# Patient Record
Sex: Male | Born: 2000 | Race: White | Hispanic: No | Marital: Single | Smoking: Never smoker
Health system: Southern US, Community
[De-identification: ages and names within clinical notes are randomized; demographics above are authoritative.]

---
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
## 2022-01-14 ENCOUNTER — Ambulatory Visit (INDEPENDENT_AMBULATORY_CARE_PROVIDER_SITE_OTHER): Payer: Managed Care, Other (non HMO) | Admitting: Family Medicine

## 2022-01-14 ENCOUNTER — Encounter: Payer: Self-pay | Admitting: Family Medicine

## 2022-01-14 VITALS — BP 116/78 | HR 113 | Temp 97.8°F | Ht 69.95 in | Wt 141.0 lb

## 2022-01-14 DIAGNOSIS — Z7689 Persons encountering health services in other specified circumstances: Secondary | ICD-10-CM

## 2022-01-14 DIAGNOSIS — F900 Attention-deficit hyperactivity disorder, predominantly inattentive type: Secondary | ICD-10-CM

## 2022-01-14 DIAGNOSIS — M545 Low back pain, unspecified: Secondary | ICD-10-CM

## 2022-01-14 NOTE — Patient Instructions (Signed)
It was a pleasure meeting you today.   As discussed, make sure you are stretching before golf or other forms of exercise.   Follow up as needed.   Back Exercises These exercises help to make your trunk and back strong. They also help to keep the lower back flexible. Doing these exercises can help to prevent or lessen pain in your lower back. If you have back pain, try to do these exercises 2-3 times each day or as told by your doctor. As you get better, do the exercises once each day. Repeat the exercises more often as told by your doctor. To stop back pain from coming back, do the exercises once each day, or as told by your doctor. Do exercises exactly as told by your doctor. Stop right away if you feel sudden pain or your pain gets worse. Exercises Single knee to chest Do these steps 3-5 times in a row for each leg: Lie on your back on a firm bed or the floor with your legs stretched out. Bring one knee to your chest. Grab your knee or thigh with both hands and hold it in place. Pull on your knee until you feel a gentle stretch in your lower back or butt. Keep doing the stretch for 10-30 seconds. Slowly let go of your leg and straighten it. Pelvic tilt Do these steps 5-10 times in a row: Lie on your back on a firm bed or the floor with your legs stretched out. Bend your knees so they point up to the ceiling. Your feet should be flat on the floor. Tighten your lower belly (abdomen) muscles to press your lower back against the floor. This will make your tailbone point up to the ceiling instead of pointing down to your feet or the floor. Stay in this position for 5-10 seconds while you gently tighten your muscles and breathe evenly. Cat-cow Do these steps until your lower back bends more easily: Get on your hands and knees on a firm bed or the floor. Keep your hands under your shoulders, and keep your knees under your hips. You may put padding under your knees. Let your head hang down  toward your chest. Tighten (contract) the muscles in your belly. Point your tailbone toward the floor so your lower back becomes rounded like the back of a cat. Stay in this position for 5 seconds. Slowly lift your head. Let the muscles of your belly relax. Point your tailbone up toward the ceiling so your back forms a sagging arch like the back of a cow. Stay in this position for 5 seconds.  Press-ups Do these steps 5-10 times in a row: Lie on your belly (face-down) on a firm bed or the floor. Place your hands near your head, about shoulder-width apart. While you keep your back relaxed and keep your hips on the floor, slowly straighten your arms to raise the top half of your body and lift your shoulders. Do not use your back muscles. You may change where you place your hands to make yourself more comfortable. Stay in this position for 5 seconds. Keep your back relaxed. Slowly return to lying flat on the floor.  Bridges Do these steps 10 times in a row: Lie on your back on a firm bed or the floor. Bend your knees so they point up to the ceiling. Your feet should be flat on the floor. Your arms should be flat at your sides, next to your body. Tighten your butt muscles and  lift your butt off the floor until your waist is almost as high as your knees. If you do not feel the muscles working in your butt and the back of your thighs, slide your feet 1-2 inches (2.5-5 cm) farther away from your butt. Stay in this position for 3-5 seconds. Slowly lower your butt to the floor, and let your butt muscles relax. If this exercise is too easy, try doing it with your arms crossed over your chest. Belly crunches Do these steps 5-10 times in a row: Lie on your back on a firm bed or the floor with your legs stretched out. Bend your knees so they point up to the ceiling. Your feet should be flat on the floor. Cross your arms over your chest. Tip your chin a little bit toward your chest, but do not bend your  neck. Tighten your belly muscles and slowly raise your chest just enough to lift your shoulder blades a tiny bit off the floor. Avoid raising your body higher than that because it can put too much stress on your lower back. Slowly lower your chest and your head to the floor. Back lifts Do these steps 5-10 times in a row: Lie on your belly (face-down) with your arms at your sides, and rest your forehead on the floor. Tighten the muscles in your legs and your butt. Slowly lift your chest off the floor while you keep your hips on the floor. Keep the back of your head in line with the curve in your back. Look at the floor while you do this. Stay in this position for 3-5 seconds. Slowly lower your chest and your face to the floor. Contact a doctor if: Your back pain gets a lot worse when you do an exercise. Your back pain does not get better within 2 hours after you exercise. If you have any of these problems, stop doing the exercises. Do not do them again unless your doctor says it is okay. Get help right away if: You have sudden, very bad back pain. If this happens, stop doing the exercises. Do not do them again unless your doctor says it is okay. This information is not intended to replace advice given to you by your health care provider. Make sure you discuss any questions you have with your health care provider. Document Revised: 03/27/2020 Document Reviewed: 03/27/2020 Elsevier Patient Education  2023 [employer].

## 2022-01-14 NOTE — Progress Notes (Signed)
New Patient Office Visit  Subjective    Patient ID: Dean Martinez, male    DOB: 2000/02/14  Age: 21 y.o. MRN: [id number]  CC:  Chief Complaint  Patient presents with   Establish Care    HPI TURNER BAILLIE presents to establish care Pediatrician   ADHD- he is not on medication. Stopped it 2 years ago. States he is doing fine without it.   Loves to travel and play golf.  His low back gets tight intermittently. No known injury. Pain is non radiating. No numbness, tingling or weakness. He does not have to take medication.   He was playing golf for [employer].  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   Family hx of diabetes in father.   Does not smoke Stopped marijuana and drinks alcohol occasionally.     Outpatient Encounter Medications as of 01/14/2022  Medication Sig   loratadine (CLARITIN) 10 MG tablet Take 10 mg by mouth daily.   [DISCONTINUED] amphetamine-dextroamphetamine (ADDERALL XR) 15 MG 24 hr capsule Take 1 capsule by mouth daily after breakfast for 30 days.   [DISCONTINUED] amphetamine-dextroamphetamine (ADDERALL XR) 15 MG 24 hr capsule Take 1 capsule by mouth daily after breakfast for 30 days.   [DISCONTINUED] amphetamine-dextroamphetamine (ADDERALL XR) 15 MG 24 hr capsule Take 1 capsule by mouth daily after breakfast.   No facility-administered encounter medications on file as of 01/14/2022.    History reviewed. No pertinent past medical history.  History reviewed. No pertinent surgical history.  History reviewed. No pertinent family history.  Social History   Socioeconomic History   Marital status: Single    Spouse name: Not on file   Number of children: Not on file   Years of education: Not on file   Highest education level: Not on file  Occupational History   Not on file  Tobacco Use   Smoking status: Never   Smokeless tobacco: Never  Vaping Use   Vaping Use: Never used  Substance and Sexual Activity   Alcohol use: Yes   Drug use:  Never   Sexual activity: Never  Other Topics Concern   Not on file  Social History Narrative   Not on file   Social Determinants of Health   Financial Resource Strain: Not on file  Food Insecurity: Not on file  Transportation Needs: Not on file  Physical Activity: Not on file  Stress: Not on file  Social Connections: Not on file  Intimate Partner Violence: Not on file    ROS      Objective    BP 116/78 (BP Location: Left Arm, Patient Position: Sitting, Cuff Size: Large)   Pulse (!) 113   Temp 97.8 F (36.6 C) (Temporal)   Ht 5' 9.95" (1.777 m)   Wt 141 lb (64 kg)   SpO2 99%   BMI 20.26 kg/m   Physical Exam Constitutional:      General: He is not in acute distress.    Appearance: He is not ill-appearing.  Cardiovascular:     Rate and Rhythm: Normal rate and regular rhythm.  Pulmonary:     Effort: Pulmonary effort is normal.     Breath sounds: Normal breath sounds.  Skin:    General: Skin is warm and dry.  Neurological:     General: No focal deficit present.     Mental Status: He is alert and oriented to person, place, and time.     Motor: No weakness.     Gait: Gait normal.  Psychiatric:  Mood and Affect: Mood normal.        Behavior: Behavior normal.         Assessment & Plan:   Problem List Items Addressed This Visit       Other   Attention deficit hyperactivity disorder (ADHD), inattentive type, moderate   Intermittent low back pain - Primary   Other Visit Diagnoses     Encounter to establish care          Discussed stretching and doing core strengthening for back pain. Follow up if worsening and consider referral to PT or sports medicine.  ADHD not on medication and doing well.  Follow up for CPE  Return if symptoms worsen or fail to improve.   Hetty Blend, NP-C

## 2022-08-07 ENCOUNTER — Telehealth: Payer: Self-pay

## 2022-08-07 NOTE — Telephone Encounter (Signed)
LVM for patient to call back [phone], or to call PCP office to schedule follow up apt. AS, CMA  

## 2023-08-13 ENCOUNTER — Encounter: Payer: Self-pay | Admitting: Advanced Practice Midwife

## 2024-01-12 ENCOUNTER — Encounter: Admitting: Family Medicine
